# Patient Record
Sex: Male | Born: 1995 | Race: White | Hispanic: No | Marital: Single | Smoking: Current every day smoker
Health system: Southern US, Community
[De-identification: ages and names within clinical notes are randomized; demographics above are authoritative.]

## PROBLEM LIST (undated history)

## (undated) DIAGNOSIS — F319 Bipolar disorder, unspecified: Secondary | ICD-10-CM

## (undated) DIAGNOSIS — F419 Anxiety disorder, unspecified: Secondary | ICD-10-CM

---
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
## 2015-11-06 ENCOUNTER — Emergency Department (HOSPITAL_COMMUNITY)
Admission: EM | Admit: 2015-11-06 | Discharge: 2015-11-06 | Disposition: A | Discharge status: Home / Self Care | Payer: PRIVATE HEALTH INSURANCE | Attending: Emergency Medicine | Admitting: Emergency Medicine

## 2015-11-06 ENCOUNTER — Encounter (HOSPITAL_COMMUNITY): Payer: Self-pay | Admitting: Emergency Medicine

## 2015-11-06 DIAGNOSIS — F1210 Cannabis abuse, uncomplicated: Secondary | ICD-10-CM

## 2015-11-06 DIAGNOSIS — R002 Palpitations: Secondary | ICD-10-CM | POA: Insufficient documentation

## 2015-11-06 DIAGNOSIS — F419 Anxiety disorder, unspecified: Secondary | ICD-10-CM | POA: Insufficient documentation

## 2015-11-06 DIAGNOSIS — F17210 Nicotine dependence, cigarettes, uncomplicated: Secondary | ICD-10-CM | POA: Insufficient documentation

## 2015-11-06 HISTORY — DX: Anxiety disorder, unspecified: F41.9

## 2015-11-06 HISTORY — DX: Bipolar disorder, unspecified: F31.9

## 2015-11-06 LAB — BASIC METABOLIC PANEL
Anion gap: 9 (ref 5–15)
BUN: 14 mg/dL (ref 6–20)
CHLORIDE: 106 mmol/L (ref 101–111)
CO2: 28 mmol/L (ref 22–32)
CREATININE: 0.79 mg/dL (ref 0.61–1.24)
Calcium: 10.5 mg/dL — ABNORMAL HIGH (ref 8.9–10.3)
GFR calc Af Amer: >60 mL/min (ref >60)
GFR calc non Af Amer: >60 mL/min (ref >60)
Glucose, Bld: 101 mg/dL — ABNORMAL HIGH (ref 65–99)
POTASSIUM: 4.5 mmol/L (ref 3.5–5.1)
SODIUM: 143 mmol/L (ref 135–145)

## 2015-11-06 LAB — CBC
HEMATOCRIT: 44.7 % (ref 39.0–52.0)
Hemoglobin: 15.3 g/dL (ref 13.0–17.0)
MCH: 29.8 pg (ref 26.0–34.0)
MCHC: 34.2 g/dL (ref 30.0–36.0)
MCV: 87.1 fL (ref 78.0–100.0)
Platelets: 228 10*3/uL (ref 150–400)
RBC: 5.13 MIL/uL (ref 4.22–5.81)
RDW: 12.8 % (ref 11.5–15.5)
WBC: 11.9 10*3/uL — AB (ref 4.0–10.5)

## 2015-11-06 LAB — URINALYSIS, ROUTINE W REFLEX MICROSCOPIC
Bilirubin Urine: NEGATIVE
GLUCOSE, UA: NEGATIVE mg/dL
HGB URINE DIPSTICK: NEGATIVE
Ketones, ur: NEGATIVE mg/dL
LEUKOCYTES UA: NEGATIVE
Nitrite: NEGATIVE
PH: 8.5 — AB (ref 5.0–8.0)
Protein, ur: NEGATIVE mg/dL
Specific Gravity, Urine: 1.009 (ref 1.005–1.030)

## 2015-11-06 LAB — RAPID URINE DRUG SCREEN, HOSP PERFORMED
Amphetamines: NOT DETECTED
Barbiturates: NOT DETECTED
Benzodiazepines: NOT DETECTED
Cocaine: NOT DETECTED
OPIATES: NOT DETECTED
Tetrahydrocannabinol: POSITIVE — AB

## 2015-11-06 NOTE — ED Notes (Signed)
MD at bedside. 

## 2015-11-06 NOTE — ED Notes (Signed)
Pt states tonight he felt like his heart popped and he got light headed  Pt states he feels like he needs to take slow deep breaths to keep his breathing straight  Pt states he has a history of bipolar depression and anxiety and has not taken medication for that for the past 4 years or so  Pt states he smokes cigarettes and weed to calm his nerves  Last smoked weed about 5 hours ago

## 2015-11-06 NOTE — Discharge Instructions (Signed)
Stop using marijuana and avoid other drugs or alcohol.   Stay hydrated.   See your doctor.   Return to ER if you have thoughts of harming yourself or others, hallucinations, chest pain, shortness of breath, worse palpitations.

## 2015-11-06 NOTE — ED Provider Notes (Signed)
CSN: [id number]     Arrival date & time 11/06/15  1910 History   First MD Initiated Contact with Patient 11/06/15 2048     Chief Complaint  Patient presents with  . Palpitations  . Anxiety     (Consider location/radiation/quality/duration/timing/severity/associated sxs/prior Treatment) The history is provided by the patient.  Wesley Ballard is a 19 y.o. male hx of bipolar, anxiety here with lightheadedness, palpitations. Patient states that he smoked some weed around 3 PM. He states that this is a new batch of weed it may be laced with something else. He states that about an hour and a half later he had sudden onset of palpitation and anxiety. Denies any chest pain or shortness of breath. He states that he has a history of bipolar and depression but he is not taking any medicines right now. He states that he is not having hallucinations and is not suicidal homicidal.      Past Medical History  Diagnosis Date  . Bipolar depression (HCC)   . Anxiety    History reviewed. No pertinent past surgical history. Family History  Problem Relation Age of Onset  . Bipolar disorder Mother   . Bipolar disorder Other    Social History  Substance Use Topics  . Smoking status: Current Every Day Smoker -- 1.00 packs/day    Types: Cigarettes  . Smokeless tobacco: None  . Alcohol Use: Yes     Comment: once a month     Review of Systems  Cardiovascular: Positive for palpitations.  All other systems reviewed and are negative.     Allergies  Review of patient's allergies indicates no known allergies.  Home Medications   Prior to Admission medications   Medication Sig Start Date End Date Taking? Authorizing Provider  cetirizine (ZYRTEC) 10 MG tablet Take 10 mg by mouth daily as needed for allergies.   Yes Historical Provider, MD  ibuprofen (ADVIL,MOTRIN) 600 MG tablet Take 1 tablet (600 mg total) by mouth every 6 (six) hours as needed for pain. Patient not taking: Reported on 11/06/2015  08/22/12   Lowanda FosterMindy Brewer, NP   BP 127/77 mmHg  Pulse 98  Temp(Src) 98.6 F (37 C) (Oral)  Resp 18  SpO2 100% Physical Exam  Constitutional: He is oriented to person, place, and time. He appears well-developed and well-nourished.  HENT:  Head: Normocephalic.  Mouth/Throat: Oropharynx is clear and moist.  Eyes: Conjunctivae are normal. Pupils are equal, round, and reactive to light.  Neck: Normal range of motion. Neck supple.  Cardiovascular: Normal rate, regular rhythm and normal heart sounds.   Pulmonary/Chest: Effort normal and breath sounds normal. No respiratory distress. He has no wheezes. He has no rales.  Abdominal: Soft. Bowel sounds are normal. He exhibits no distension. There is no tenderness. There is no rebound.  Musculoskeletal: Normal range of motion. He exhibits no edema or tenderness.  Neurological: He is alert and oriented to person, place, and time. No cranial nerve deficit. Coordination normal.  No tremors   Skin: Skin is warm and dry.  Psychiatric: He has a normal mood and affect. His behavior is normal. Judgment and thought content normal.  Nursing note and vitals reviewed.   ED Course  Procedures (including critical care time) Labs Review Labs Reviewed  BASIC METABOLIC PANEL - Abnormal; Notable for the following:    Glucose, Bld 101 (*)    Calcium 10.5 (*)    All other components within normal limits  CBC - Abnormal; Notable for the following:  WBC 11.9 (*)    All other components within normal limits  URINALYSIS, ROUTINE W REFLEX MICROSCOPIC (NOT AT [facility]) - Abnormal; Notable for the following:    APPearance CLOUDY (*)    pH 8.5 (*)    All other components within normal limits  URINE RAPID DRUG SCREEN, HOSP PERFORMED - Abnormal; Notable for the following:    Tetrahydrocannabinol POSITIVE (*)    All other components within normal limits    Imaging Review No results found. I have personally reviewed and evaluated these images and lab results as part  of my medical decision-making.   EKG Interpretation   Date/Time:  Wednesday November 06 2015 19:56:40 EST Ventricular Rate:  101 PR Interval:  134 QRS Duration: 119 QT Interval:  343 QTC Calculation: 445 R Axis:   87 Text Interpretation:  Sinus tachycardia Nonspecific intraventricular  conduction delay No previous ECGs available Confirmed by Kallen Mccrystal  MD, Gissele Narducci  ([id number]) on 11/06/2015 9:10:13 PM      MDM   Final diagnoses:  None    Wesley Ballard is a 19 y.o. male here with palpitations after taking marijuana. Denies alcohol use and other drug use. HR 90s-100 on monitor, EKG unremarkable. Labs at baseline, UDS + marijuana. I think likely it was laced with some other substances not detectable with UDS. Remained afebrile and tachycardia resolved. Told him not to use drugs. He is not suicidal or homicidal and has no hallucinations. Will dc home.      Richardean Canal, MD 11/06/15 2227

## 2024-11-06 ENCOUNTER — Emergency Department
Admission: EM | Admit: 2024-11-06 | Discharge: 2024-11-06 | Disposition: A | Discharge status: Home / Self Care | Payer: Self-pay | Attending: Emergency Medicine | Admitting: Emergency Medicine

## 2024-11-06 ENCOUNTER — Other Ambulatory Visit: Payer: Self-pay

## 2024-11-06 DIAGNOSIS — K029 Dental caries, unspecified: Secondary | ICD-10-CM | POA: Insufficient documentation

## 2024-11-06 MED ORDER — AMOXICILLIN 500 MG PO CAPS
500.0000 mg | ORAL_CAPSULE | Freq: Once | ORAL | Status: AC
  Administered 2024-11-06: 500 mg via ORAL
  Filled 2024-11-06: qty 1

## 2024-11-06 MED ORDER — AMOXICILLIN 500 MG PO CAPS: 500.0000 mg | ORAL_CAPSULE | Freq: Three times a day (TID) | ORAL | 0 refills | Status: AC

## 2024-11-06 NOTE — ED Provider Notes (Signed)
 "   [facility] Emergency Department Provider Note     Event Date/Time   First MD Initiated Contact with Patient 11/06/24 2021     (approximate)   History   Dental Pain   HPI  Wesley Ballard is a 28 y.o. male With a history of bipolar disorder and anxiety, presents to the ED endorsing left-sided dental pain.  He localizes pain to the upper and lower jaws.  He reports poor dentition in general, and has had recurrent dental infections.  He reports his last treatment was over the summer.  Patient does not endorse any current dental care, and is denying any fevers, chills, sweats pain or difficulty breathing, swallowing, or controlling oral secretions.     Physical Exam   Triage Vital Signs: ED Triage Vitals  Encounter Vitals Group     BP 11/06/24 1903 133/79     Girls Systolic BP Percentile --      Girls Diastolic BP Percentile --      Boys Systolic BP Percentile --      Boys Diastolic BP Percentile --      Pulse Rate 11/06/24 1903 77     Resp 11/06/24 1903 18     Temp 11/06/24 1903 98.6 F (37 C)     Temp Source 11/06/24 1903 Oral     SpO2 11/06/24 1903 98 %     Weight --      Height --      Head Circumference --      Peak Flow --      Pain Score 11/06/24 1902 4     Pain Loc --      Pain Education --      Exclude from Growth Chart --     Most recent vital signs: Vitals:   11/06/24 1903  BP: 133/79  Pulse: 77  Resp: 18  Temp: 98.6 F (37 C)  SpO2: 98%    General Awake, no distress. NAD HEENT NCAT. PERRL. EOMI. No rhinorrhea. Mucous membranes are moist.  Uvula is midline and tonsils are flat.  No oropharyngeal lesions noted.  Patient with broken and decayed teeth and areas of concern to the upper and lower jaw.  No focal gum swelling is noted.  No purulence, no edema is appreciated. CV:  Good peripheral perfusion.  RRR RESP:  Normal effort.   ED Results / Procedures / Treatments   Labs (all labs ordered are listed, but only abnormal  results are displayed) Labs Reviewed - No data to display   EKG   RADIOLOGY   No results found.   PROCEDURES:  Critical Care performed: No  Procedures   MEDICATIONS ORDERED IN ED: Medications  amoxicillin  (AMOXIL ) capsule 500 mg (500 mg Oral Given 11/06/24 2047)     IMPRESSION / MDM / ASSESSMENT AND PLAN / ED COURSE  I reviewed the triage vital signs and the nursing notes.                              Differential diagnosis includes, but is not limited to, dental infection, dental caries, dental abscess, gingivitis  Patient's presentation is most consistent with acute, uncomplicated illness.  Patient's diagnosis is consistent with dental pain secondary to dental caries.  Patient with reassuring exam and workup at this time.  No indication of any focal gum swelling, space-occupying lesion, or submandibular infection.  Patient will be discharged home with prescriptions for amoxicillin . Patient  is to follow up with a local dental provider as suggested, as needed or otherwise directed. Patient is given ED precautions to return to the ED for any worsening or new symptoms.     FINAL CLINICAL IMPRESSION(S) / ED DIAGNOSES   Final diagnoses:  Dental caries  Pain due to dental caries     Rx / DC Orders   ED Discharge Orders          Ordered    amoxicillin  (AMOXIL ) 500 MG capsule  3 times daily        11/06/24 2047             Note:  This document was prepared using Dragon voice recognition software and may include unintentional dictation errors.    Loyd Candida LULLA Aldona, PA-C 11/06/24 2119    Claudene Rover, MD 11/06/24 2209  "

## 2024-11-06 NOTE — ED Triage Notes (Signed)
 Pt arrives POV ambulatory to triage, gait steady, no acute distress noted c/fo left sided upper/lower dental pain. Pt reports reoccurring infection in the area, last dose of antibiotics was over the summer. Pt states the pain is more intense, therefore, he thinks it is infected again. Pt took ibuprofen  1 hr pta.

## 2024-11-06 NOTE — ED Notes (Signed)
 AVS provided by edp was reviewed with the pt. Pt verbalized understanding with no additional questions at this time. Pharmacy was verified with pt.

## 2024-11-06 NOTE — Discharge Instructions (Signed)
